# Patient Record
Sex: Male | Born: 1995 | Race: White | Hispanic: No | Marital: Single | Smoking: Never smoker
Health system: Southern US, Community
[De-identification: ages and names within clinical notes are randomized; demographics above are authoritative.]

## PROBLEM LIST (undated history)

## (undated) HISTORY — PX: KNEE ARTHROSCOPY: SHX127

---
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
## 2015-11-01 ENCOUNTER — Emergency Department (HOSPITAL_COMMUNITY)
Admission: EM | Admit: 2015-11-01 | Discharge: 2015-11-01 | Disposition: A | Discharge status: Home / Self Care | Payer: Self-pay | Attending: Emergency Medicine | Admitting: Emergency Medicine

## 2015-11-01 ENCOUNTER — Encounter (HOSPITAL_COMMUNITY): Payer: Self-pay | Admitting: *Deleted

## 2015-11-01 DIAGNOSIS — J039 Acute tonsillitis, unspecified: Secondary | ICD-10-CM | POA: Insufficient documentation

## 2015-11-01 MED ORDER — AMOXICILLIN 250 MG PO CAPS
500.0000 mg | ORAL_CAPSULE | Freq: Once | ORAL | Status: AC
  Administered 2015-11-01: 500 mg via ORAL
  Filled 2015-11-01: qty 2

## 2015-11-01 MED ORDER — AMOXICILLIN 500 MG PO CAPS: 500.0000 mg | ORAL_CAPSULE | Freq: Three times a day (TID) | ORAL | Status: AC

## 2015-11-01 NOTE — ED Provider Notes (Signed)
CSN: [id number]     Arrival date & time 11/01/15  1343 History   First MD Initiated Contact with Patient 11/01/15 1406     Chief Complaint  Patient presents with  . Sore Throat     (Consider location/radiation/quality/duration/timing/severity/associated sxs/prior Treatment) The history is provided by the patient and a parent.   Frederick Morris is a 20 y.o. male presenting with a one week history of mild nasal congestion and clear rhinorhea and sore throat, which has cleared but has now developed swollen tonsils with white patches, denies any continued significant throat pain, feels swollen in his throat.  He has had subjective fever.  He has taken ibuprofen with improved discomfort.  He denies shortness of breath, wheezing or stridor.  He is able to tolerate by mouth intake.     History reviewed. No pertinent past medical history. Past Surgical History  Procedure Laterality Date  . Knee arthroscopy Right    History reviewed. No pertinent family history. Social History  Substance Use Topics  . Smoking status: Never Smoker   . Smokeless tobacco: Never Used  . Alcohol Use: No    Review of Systems  Constitutional: Negative for fever and chills.  HENT: Positive for congestion, rhinorrhea and sore throat. Negative for ear pain, sinus pressure, trouble swallowing and voice change.   Eyes: Negative for discharge.  Respiratory: Negative for cough, shortness of breath, wheezing and stridor.   Cardiovascular: Negative for chest pain.  Gastrointestinal: Negative for abdominal pain.  Genitourinary: Negative.       Allergies  Cefprozil  Home Medications   Prior to Admission medications   Medication Sig Start Date End Date Taking? Authorizing Provider  amoxicillin (AMOXIL) 500 MG capsule Take 1 capsule (500 mg total) by mouth 3 (three) times daily. 11/01/15 11/11/15  Burgess Amor, PA-C  guaiFENesin-codeine 100-10 MG/5ML syrup Take 5-10 mLs by mouth 3 (three) times daily as needed for  cough. Patient not taking: Reported on 11/01/2015 10/22/13   Marlon Pel, PA-C   BP 124/68 mmHg  Pulse 60  Temp(Src) 97.8 F (36.6 C) (Oral)  Resp 16  Ht  (1.778 m)  Wt 65.772 kg  BMI 20.81 kg/m2  SpO2 98% Physical Exam  Constitutional: He is oriented to person, place, and time. He appears well-developed and well-nourished.  HENT:  Head: Normocephalic and atraumatic.  Right Ear: Tympanic membrane and ear canal normal.  Left Ear: Tympanic membrane and ear canal normal.  Nose: No mucosal edema or rhinorrhea.  Mouth/Throat: Uvula is midline and mucous membranes are normal. Oropharyngeal exudate, posterior oropharyngeal edema and posterior oropharyngeal erythema present. No tonsillar abscesses.  2+ tonsillar hypertrophy which is symmetric.  Touching the uvula which is midline.  Scattered white pus pockets on tonsils.  Eyes: Conjunctivae are normal.  Cardiovascular: Normal rate and normal heart sounds.   Pulmonary/Chest: Effort normal. No stridor. No respiratory distress. He has no wheezes. He has no rales.  Abdominal: Soft. There is no tenderness.  Musculoskeletal: Normal range of motion.  Lymphadenopathy:    He has no cervical adenopathy.  Neurological: He is alert and oriented to person, place, and time.  Skin: Skin is warm and dry. No rash noted.  Psychiatric: He has a normal mood and affect.    ED Course  Procedures (including critical care time) Labs Review Labs Reviewed - No data to display  Imaging Review No results found. I have personally reviewed and evaluated these images and lab results as part of my medical decision-making.  EKG Interpretation None      MDM   Final diagnoses:  Acute tonsillitis, unspecified etiology    Mother at bedside endorses patient can take penicillin.  He was prescribed Amoxil with first dose given here.  Advise when necessary follow-up, returning here for any worsened symptoms.    Ellagrace Yoshida IdolBurgess Amor01/19/17 1505  Rolland Porter, MD 11/06/15 [phone]

## 2015-11-01 NOTE — ED Notes (Signed)
States wokeup this morning with uvula swollen, denies recent N/V/D or fever

## 2015-11-01 NOTE — Discharge Instructions (Signed)

## 2024-01-18 ENCOUNTER — Other Ambulatory Visit: Payer: Self-pay

## 2024-01-18 ENCOUNTER — Encounter (HOSPITAL_COMMUNITY): Payer: Self-pay

## 2024-01-18 ENCOUNTER — Emergency Department (HOSPITAL_COMMUNITY)
Admission: EM | Admit: 2024-01-18 | Discharge: 2024-01-18 | Disposition: A | Discharge status: Home / Self Care | Attending: Emergency Medicine | Admitting: Emergency Medicine

## 2024-01-18 ENCOUNTER — Emergency Department (HOSPITAL_COMMUNITY)

## 2024-01-18 DIAGNOSIS — S39012A Strain of muscle, fascia and tendon of lower back, initial encounter: Secondary | ICD-10-CM | POA: Diagnosis not present

## 2024-01-18 DIAGNOSIS — X58XXXA Exposure to other specified factors, initial encounter: Secondary | ICD-10-CM | POA: Insufficient documentation

## 2024-01-18 DIAGNOSIS — S3992XA Unspecified injury of lower back, initial encounter: Secondary | ICD-10-CM | POA: Diagnosis present

## 2024-01-18 DIAGNOSIS — Z859 Personal history of malignant neoplasm, unspecified: Secondary | ICD-10-CM | POA: Diagnosis not present

## 2024-01-18 DIAGNOSIS — Z21 Asymptomatic human immunodeficiency virus [HIV] infection status: Secondary | ICD-10-CM | POA: Diagnosis not present

## 2024-01-18 MED ORDER — METHOCARBAMOL 750 MG PO TABS: 750.0000 mg | ORAL_TABLET | Freq: Three times a day (TID) | ORAL | 0 refills | Status: AC

## 2024-01-18 MED ORDER — KETOROLAC TROMETHAMINE 15 MG/ML IJ SOLN
15.0000 mg | Freq: Once | INTRAMUSCULAR | Status: AC
  Administered 2024-01-18: 15 mg via INTRAMUSCULAR
  Filled 2024-01-18: qty 1

## 2024-01-18 MED ORDER — NAPROXEN 500 MG PO TABS: 500.0000 mg | ORAL_TABLET | Freq: Two times a day (BID) | ORAL | 0 refills | Status: AC

## 2024-01-18 MED ORDER — METHOCARBAMOL 500 MG PO TABS
750.0000 mg | ORAL_TABLET | Freq: Once | ORAL | Status: AC
Start: 2024-01-18 — End: 2024-01-18
  Administered 2024-01-18: 750 mg via ORAL
  Filled 2024-01-18: qty 2

## 2024-01-18 NOTE — ED Triage Notes (Signed)
 Pt arrived via POV c/o lower back pain that Pt reports began when he was getting out of his vehicle this morning. Pt reports he exerted himself lightly this past weekend picking up a fallen tree in his yard, but otherwise denies injury. Pt reports pain lessens when he is moving around.

## 2024-01-18 NOTE — ED Provider Notes (Signed)
 [place] EMERGENCY DEPARTMENT AT [facility] Provider Note   CSN: [id number] Arrival date & time: 01/18/24  0941     History  Chief Complaint  Patient presents with   Back Pain    Frederick Morris is a 28 y.o. male.  Patient is a 28 year old male who presents to the emergency department the chief complaint of low back pain.  Patient notes that the pain began this morning.  He notes that he did cough and notes that he felt something pull in his lower back.  He notes that he has not taken anything for his symptoms as of yet.  He denies any associated urinary bowel incontinence, saddle paresthesias, gait changes, fever, chills, history of IV drug use, history of HIV, history of cancer, history chronic steroid use.  Patient denies any associate abdominal pain, dysuria or hematuria.   Back Pain      Home Medications Prior to Admission medications   Medication Sig Start Date End Date Taking? Authorizing Provider  guaiFENesin-codeine 100-10 MG/5ML syrup Take 5-10 mLs by mouth 3 (three) times daily as needed for cough. Patient not taking: Reported on 11/01/2015 10/22/13   Marlon Pel, PA-C      Allergies    Cefprozil    Review of Systems   Review of Systems  Musculoskeletal:  Positive for back pain.  All other systems reviewed and are negative.   Physical Exam Updated Vital Signs BP (!) 151/89 (BP Location: Right Arm)   Pulse 78   Temp 98.2 F (36.8 C) (Temporal)   Resp 16   Ht 5\' 10"  (1.778 m)   Wt 86.2 kg   SpO2 99%   BMI 27.26 kg/m  Physical Exam Vitals and nursing note reviewed.  Constitutional:      Appearance: Normal appearance.  HENT:     Head: Normocephalic and atraumatic.     Nose: Nose normal.     Mouth/Throat:     Mouth: Mucous membranes are moist.  Eyes:     Extraocular Movements: Extraocular movements intact.     Conjunctiva/sclera: Conjunctivae normal.     Pupils: Pupils are equal, round, and reactive to light.  Cardiovascular:      Rate and Rhythm: Normal rate and regular rhythm.     Pulses: Normal pulses.     Heart sounds: Normal heart sounds.  Pulmonary:     Effort: Pulmonary effort is normal.     Breath sounds: Normal breath sounds.  Abdominal:     General: Abdomen is flat. Bowel sounds are normal. There is no distension.     Palpations: Abdomen is soft.     Tenderness: There is no abdominal tenderness. There is no guarding.  Musculoskeletal:        General: Normal range of motion.     Cervical back: Normal range of motion and neck supple.     Comments: Tenderness palpation over lower lumbar spine and paraspinous muscles, no step-off or deformity, no CVA tenderness  Skin:    General: Skin is warm and dry.  Neurological:     General: No focal deficit present.     Mental Status: He is alert and oriented to person, place, and time. Mental status is at baseline.     Cranial Nerves: No cranial nerve deficit.     Sensory: No sensory deficit.     Motor: No weakness.     Coordination: Coordination normal.     Deep Tendon Reflexes: Reflexes normal.     Comments: Essentially  bilateral great toes intact  Psychiatric:        Mood and Affect: Mood normal.        Behavior: Behavior normal.        Thought Content: Thought content normal.        Judgment: Judgment normal.     ED Results / Procedures / Treatments   Labs (all labs ordered are listed, but only abnormal results are displayed) Labs Reviewed - No data to display  EKG None  Radiology No results found.  Procedures Procedures    Medications Ordered in ED Medications  ketorolac (TORADOL) 15 MG/ML injection 15 mg (has no administration in time range)  methocarbamol (ROBAXIN) tablet 750 mg (has no administration in time range)    ED Course/ Medical Decision Making/ A&P                                 Medical Decision Making Amount and/or Complexity of Data Reviewed Radiology: ordered.  Risk Prescription drug management.   This patient  presents to the ED for concern of low back pain differential diagnosis includes muscle strain, fracture, cauda equina syndrome, vertebral osteomyelitis, epidural abscess, pyelonephritis, kidney stone    Additional history obtained:  Additional history obtained from none External records from outside source obtained and reviewed including none   Imaging Studies ordered:  I ordered imaging studies including x-ray of lumbar spine I independently visualized and interpreted imaging which showed no acute osseous injury or lesions I agree with the radiologist interpretation   Medicines ordered and prescription drug management:  I ordered medication including naproxen, Robaxin for muscle strain Reevaluation of the patient after these medicines showed that the patient improved I have reviewed the patients home medicines and have made adjustments as needed   Problem List / ED Course:  Patient is doing well at this time and is stable for discharge home.  Discussed with patient that symptoms are most consistent with muscle strain at this time.  Do not Spectamine etiology such as cauda equina syndrome, vertebral cellulitis, epidural abscess.  Patient is TUNAFISH negative and has no concerning neurological deficits.  Patient has no abdominal pain and do not suspect acute intra-abdominal surgical pathology at this point.  He has no associated urinary complaints.  The need for close follow-up with primary care doctor on an outpatient basis was discussed as well as strict turn precautions for any new or worsening symptoms.  Patient voiced understanding and had no additional questions.   Social Determinants of Health:  None           Final Clinical Impression(s) / ED Diagnoses Final diagnoses:  None    Rx / DC Orders ED Discharge Orders     None         Kathlen Mody 01/18/24 1129    Bethann Berkshire, MD 01/20/24 [phone]

## 2024-01-18 NOTE — Discharge Instructions (Signed)
 Closely with a primary care doctor on an outpatient basis.  Return to emergency department immediately for any new or worsening symptoms.  [facility] Primary Care Doctor List    Syliva Overman, MD. Specialty: [facility] Medicine Contact information: [address]  [place] [address]  [phone]   Lilyan Punt, MD. Specialty: [facility] Medicine Contact information: [address]  [place] [address]  [phone]   Avon Gully, MD Specialty: Internal Medicine Contact information: [address] [place] [address]  [phone]   Catalina Pizza, MD. Specialty: Internal Medicine Contact information: [address]  [place] [address]  [phone]    [facility] (Dr. Selena Batten) Specialty: Family Medicine Contact information: [address]  [place] [address]  [phone]   John Giovanni, MD. Specialty: [facility] Medicine Contact information: [address]  [address]  [place] [address]  [phone]   Carylon Perches, MD. Specialty: Internal Medicine Contact information: [address]  [address]  [place] [address]  [phone]   [facility] Family Medicine: [address] [phone]  [facility], Family medicine [address]  [phone]  [facility] [address] [place], [place] [phone]  [facility]: [address] [phone]    [facility]  [address] [place], [place] [address] [phone]  Services [facility] offers a variety of basic health services.  Services include but are not limited to: Blood pressure checks  Heart rate checks  Blood sugar checks  Urine analysis  Rapid strep tests  Pregnancy tests.  Health education and referrals  People needing more complex services will be directed to a physician online. Using these virtual visits, doctors can evaluate and prescribe  medicine and treatments. There will be no medication on-site, though [facility] will help patients fill their prescriptions at little to no cost.   For More information please go to: [email or web address]  Allergy and Asthma:    [address] [place] [phone]  Urology:  [address]   [phone]  [facility]  [address] [place], [place] [phone]  Orthopedics   [address] [place], [place] [phone]  Endocrinology  [address] [place], [place] [phone]  Podiatry: [facility] [phone]

## 2024-01-18 NOTE — ED Notes (Signed)
 Pt to xray at this time.
